# Patient Record
Sex: Male | Born: 1980 | Race: White | Hispanic: No | Marital: Married | Smoking: Former smoker
Health system: Southern US, Community
[De-identification: ages and names within clinical notes are randomized; demographics above are authoritative.]

---

## 2004-11-30 ENCOUNTER — Emergency Department: Payer: Self-pay | Admitting: Emergency Medicine

## 2004-12-05 ENCOUNTER — Ambulatory Visit: Payer: Self-pay

## 2005-05-26 ENCOUNTER — Emergency Department: Payer: Self-pay | Admitting: Emergency Medicine

## 2009-04-22 ENCOUNTER — Emergency Department (HOSPITAL_COMMUNITY): Admission: EM | Admit: 2009-04-22 | Discharge: 2009-04-23 | Payer: Self-pay | Admitting: Emergency Medicine

## 2009-05-13 ENCOUNTER — Encounter: Admission: RE | Admit: 2009-05-13 | Discharge: 2009-05-13 | Payer: Self-pay | Admitting: Family Medicine

## 2009-07-14 ENCOUNTER — Ambulatory Visit (HOSPITAL_COMMUNITY): Admission: RE | Admit: 2009-07-14 | Discharge: 2009-07-14 | Payer: Self-pay | Admitting: Psychiatry

## 2009-07-18 ENCOUNTER — Encounter: Admission: RE | Admit: 2009-07-18 | Discharge: 2009-07-18 | Payer: Self-pay | Admitting: Psychiatry

## 2009-11-01 ENCOUNTER — Ambulatory Visit (HOSPITAL_COMMUNITY): Admission: RE | Admit: 2009-11-01 | Discharge: 2009-11-01 | Payer: Self-pay | Admitting: Cardiology

## 2009-11-10 ENCOUNTER — Ambulatory Visit (HOSPITAL_COMMUNITY): Admission: RE | Admit: 2009-11-10 | Discharge: 2009-11-10 | Payer: Self-pay | Admitting: Cardiology

## 2010-10-15 ENCOUNTER — Encounter: Payer: Self-pay | Admitting: Family Medicine

## 2010-12-11 IMAGING — CT CT HEAD W/O CM
1 series · 16 of 30 positions shown, 20 images · non-contrast
Comparison: None

CLINICAL DATA: Shortness of breath.

CT HEAD WITHOUT CONTRAST
TECHNIQUE: Contiguous axial images were obtained from the base of
the skull through the vertex without contrast.

[Series 2: headseq 4.8 h45s · axial · 0.43mm/px · z∈[-184,-32]mm · 16 of 36 slices shown, 20 images]
[im 2/36  brain]
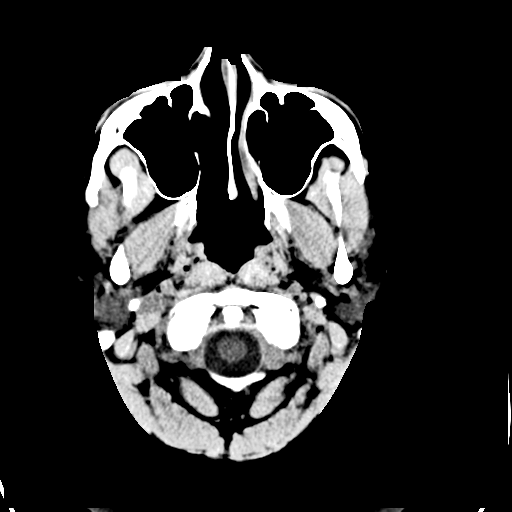
[im 2/36  bone]
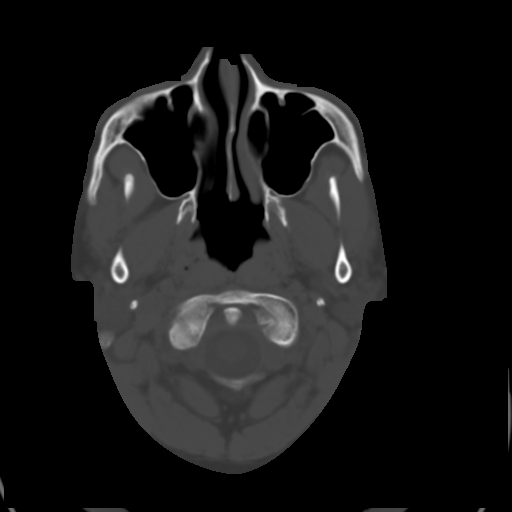
[im 4/36  brain]
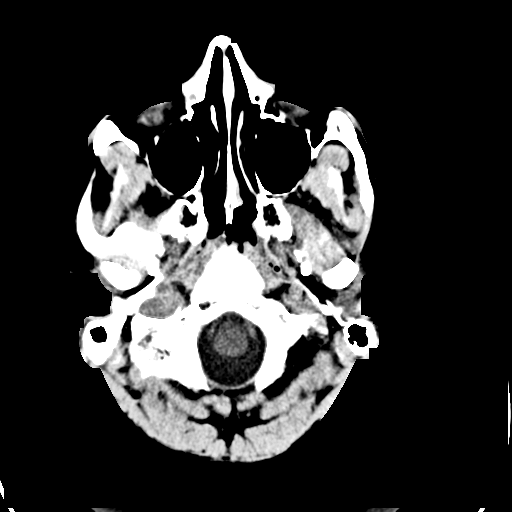
[im 7/36  brain]
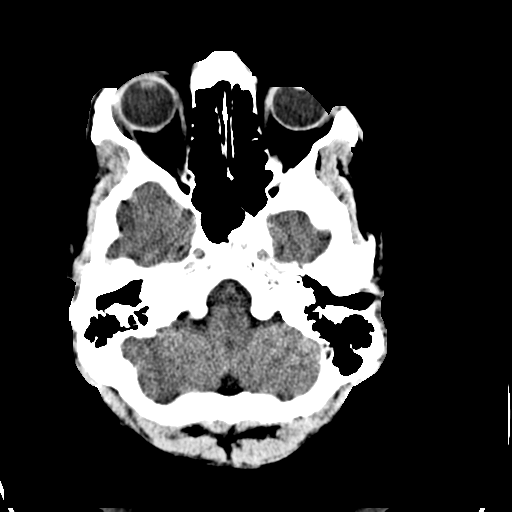
[im 9/36  brain]
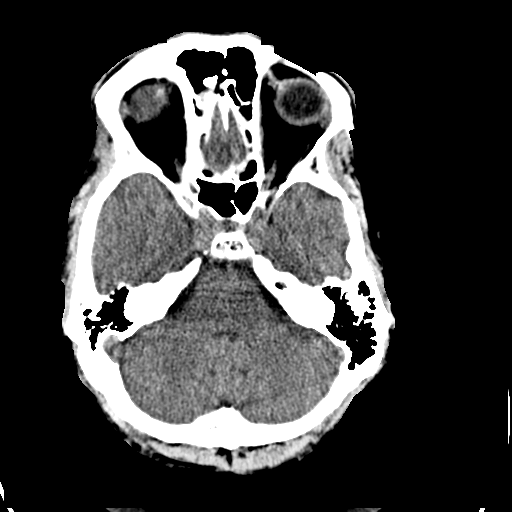
[im 10/36  brain]
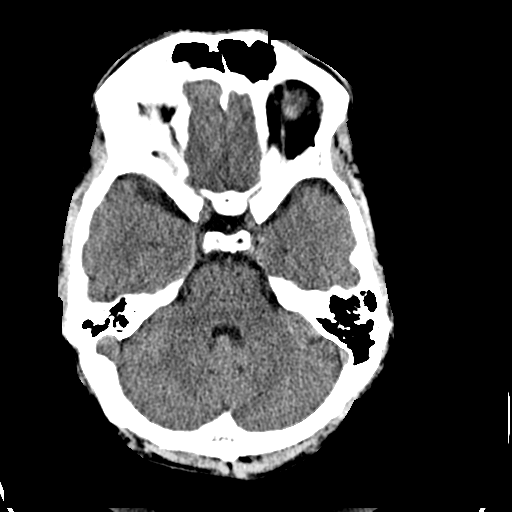
[im 10/36  bone]
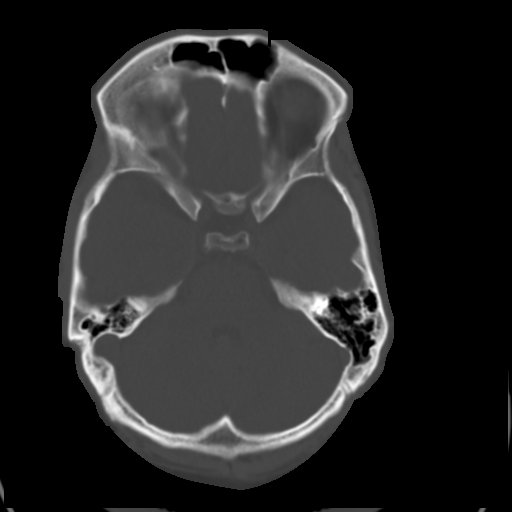
[im 13/36  brain]
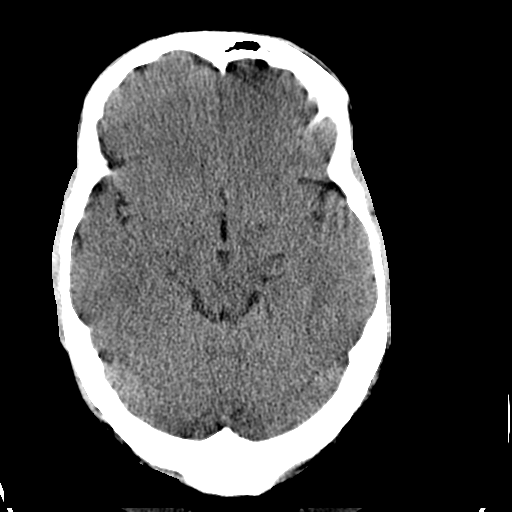
[im 15/36  brain]
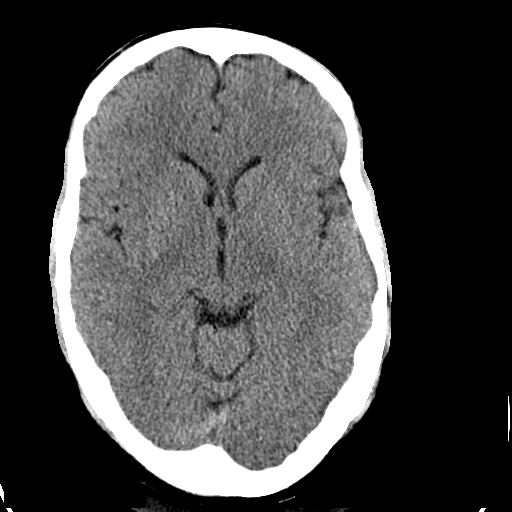
[im 17/36  brain]
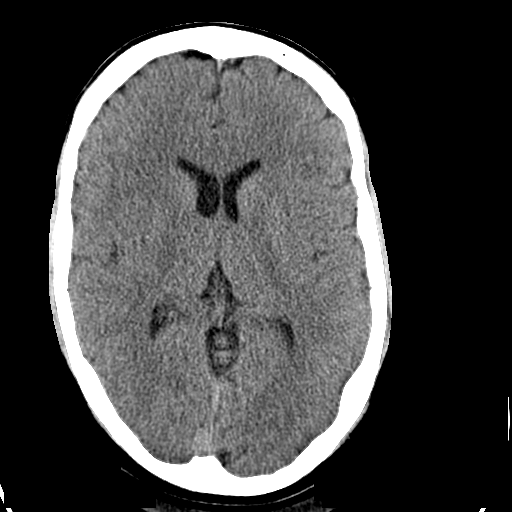
[im 19/36  brain]
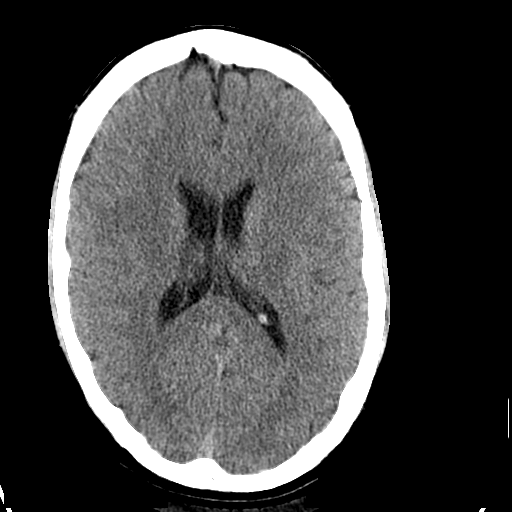
[im 19/36  bone]
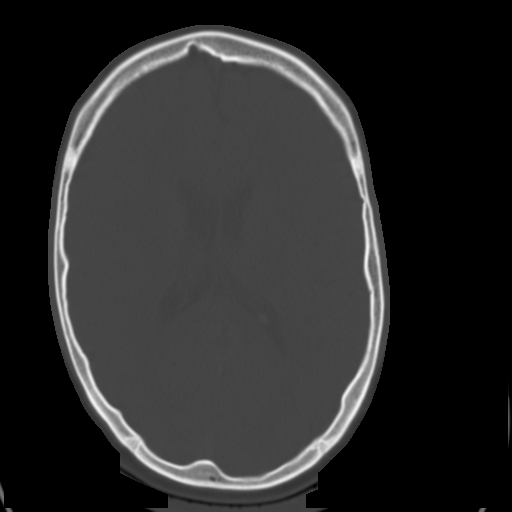
[im 21/36  brain]
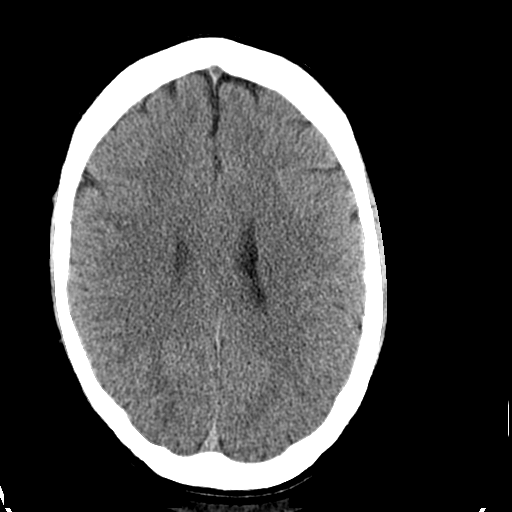
[im 23/36  brain]
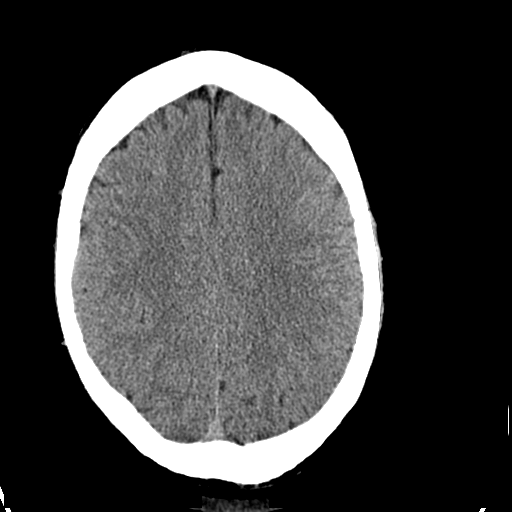
[im 26/36  brain]
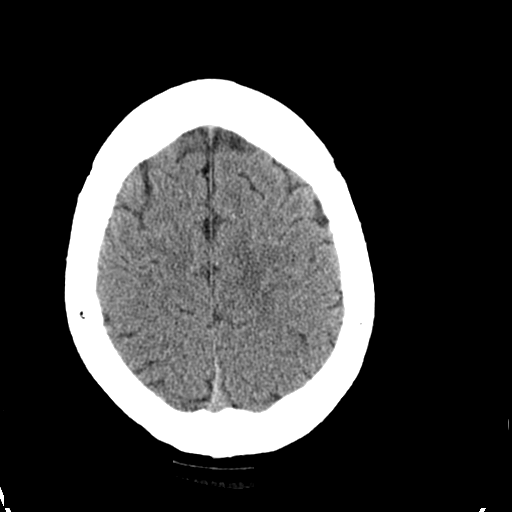
[im 27/36  brain]
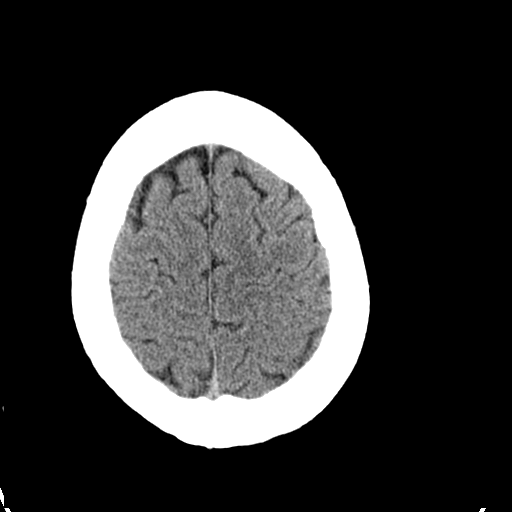
[im 27/36  bone]
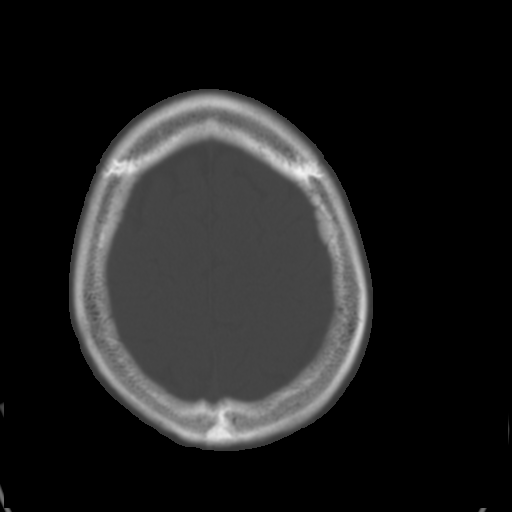
[im 29/36  brain]
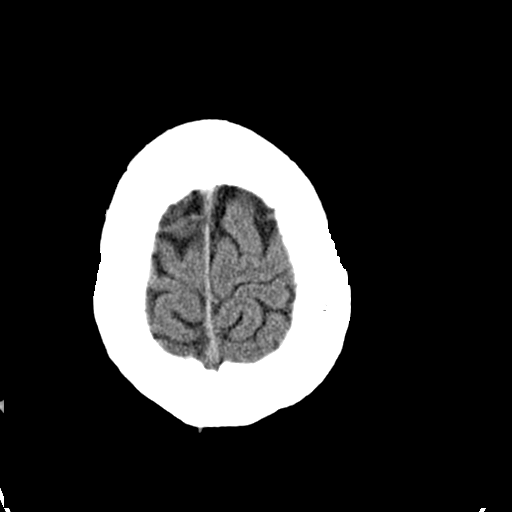
[im 32/36  brain]
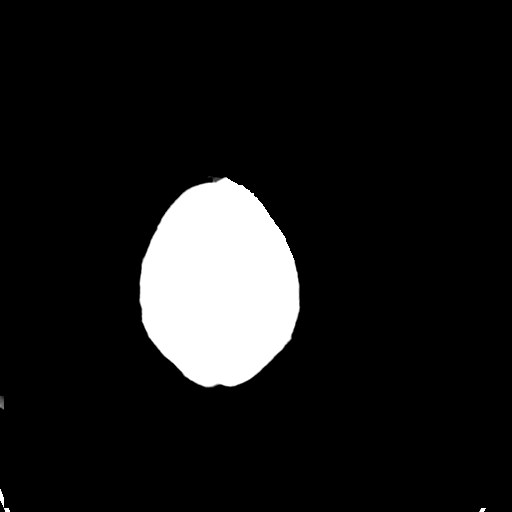
[im 34/36  brain]
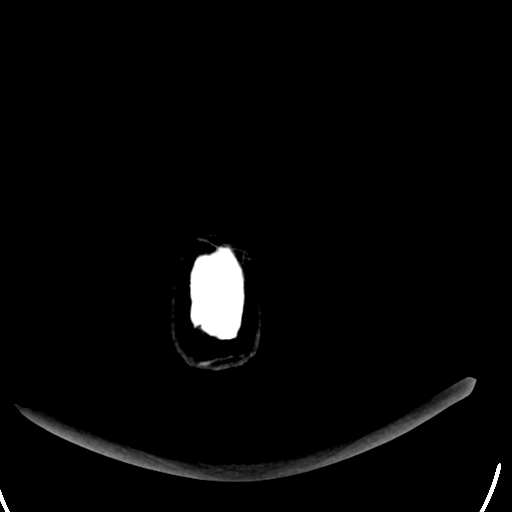

[16 of 30 positions shown; findings below may reference images not displayed]

FINDINGS: No acute intracranial abnormality.  Specifically, no
hemorrhage, hydrocephalus, mass lesion, acute infarction, or
significant intracranial injury.  No acute calvarial abnormality.

Visualized paranasal sinuses and mastoids clear.  Orbital soft
tissues unremarkable.
IMPRESSION: No acute intracranial abnormality.

## 2010-12-11 IMAGING — CR DG CHEST 2V
2 series · 2 of 2 positions shown · non-contrast
Comparison: None.

CLINICAL DATA: Shortness of breath.  Left arm pain.

CHEST - 2 VIEW

[w chest pa]
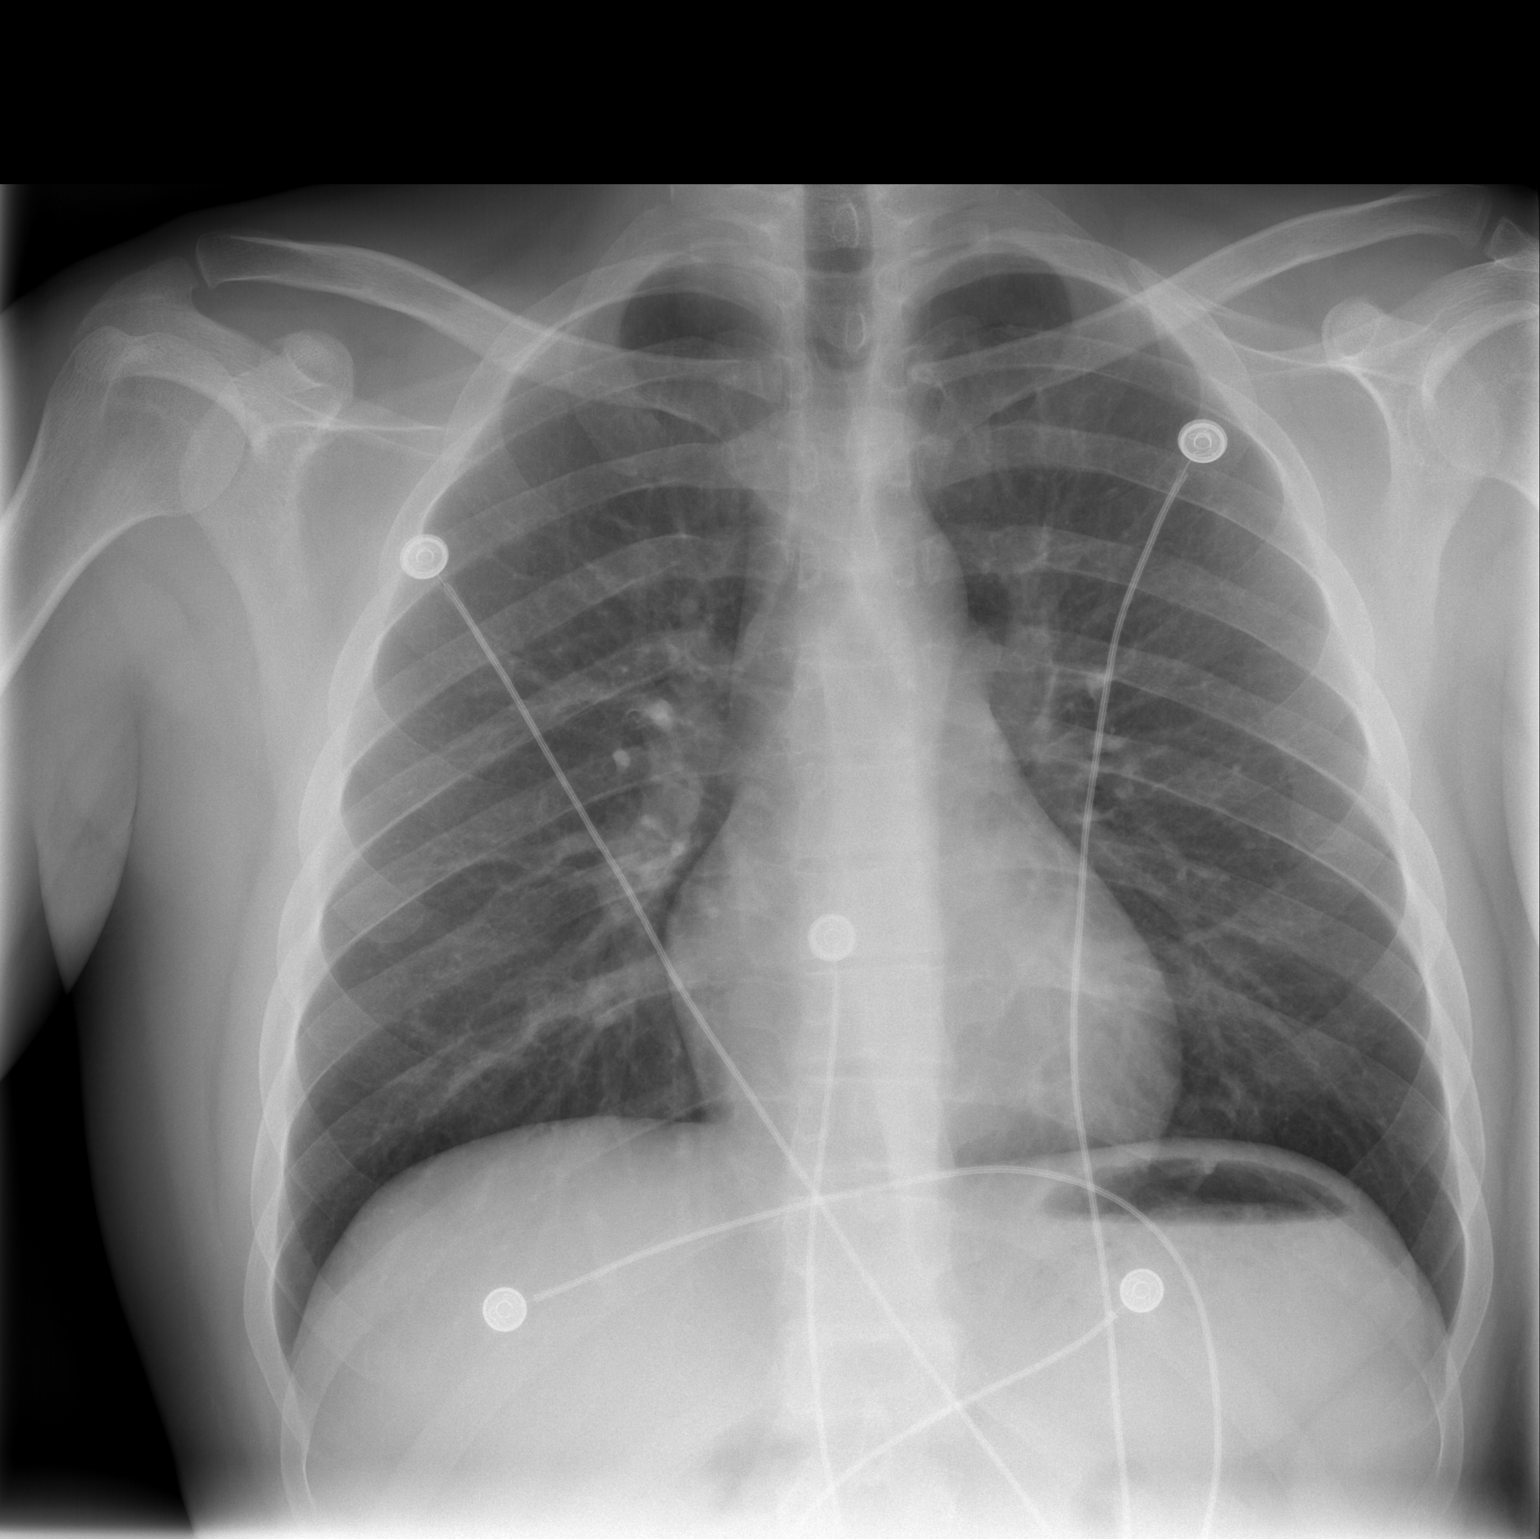

[w chest lat]
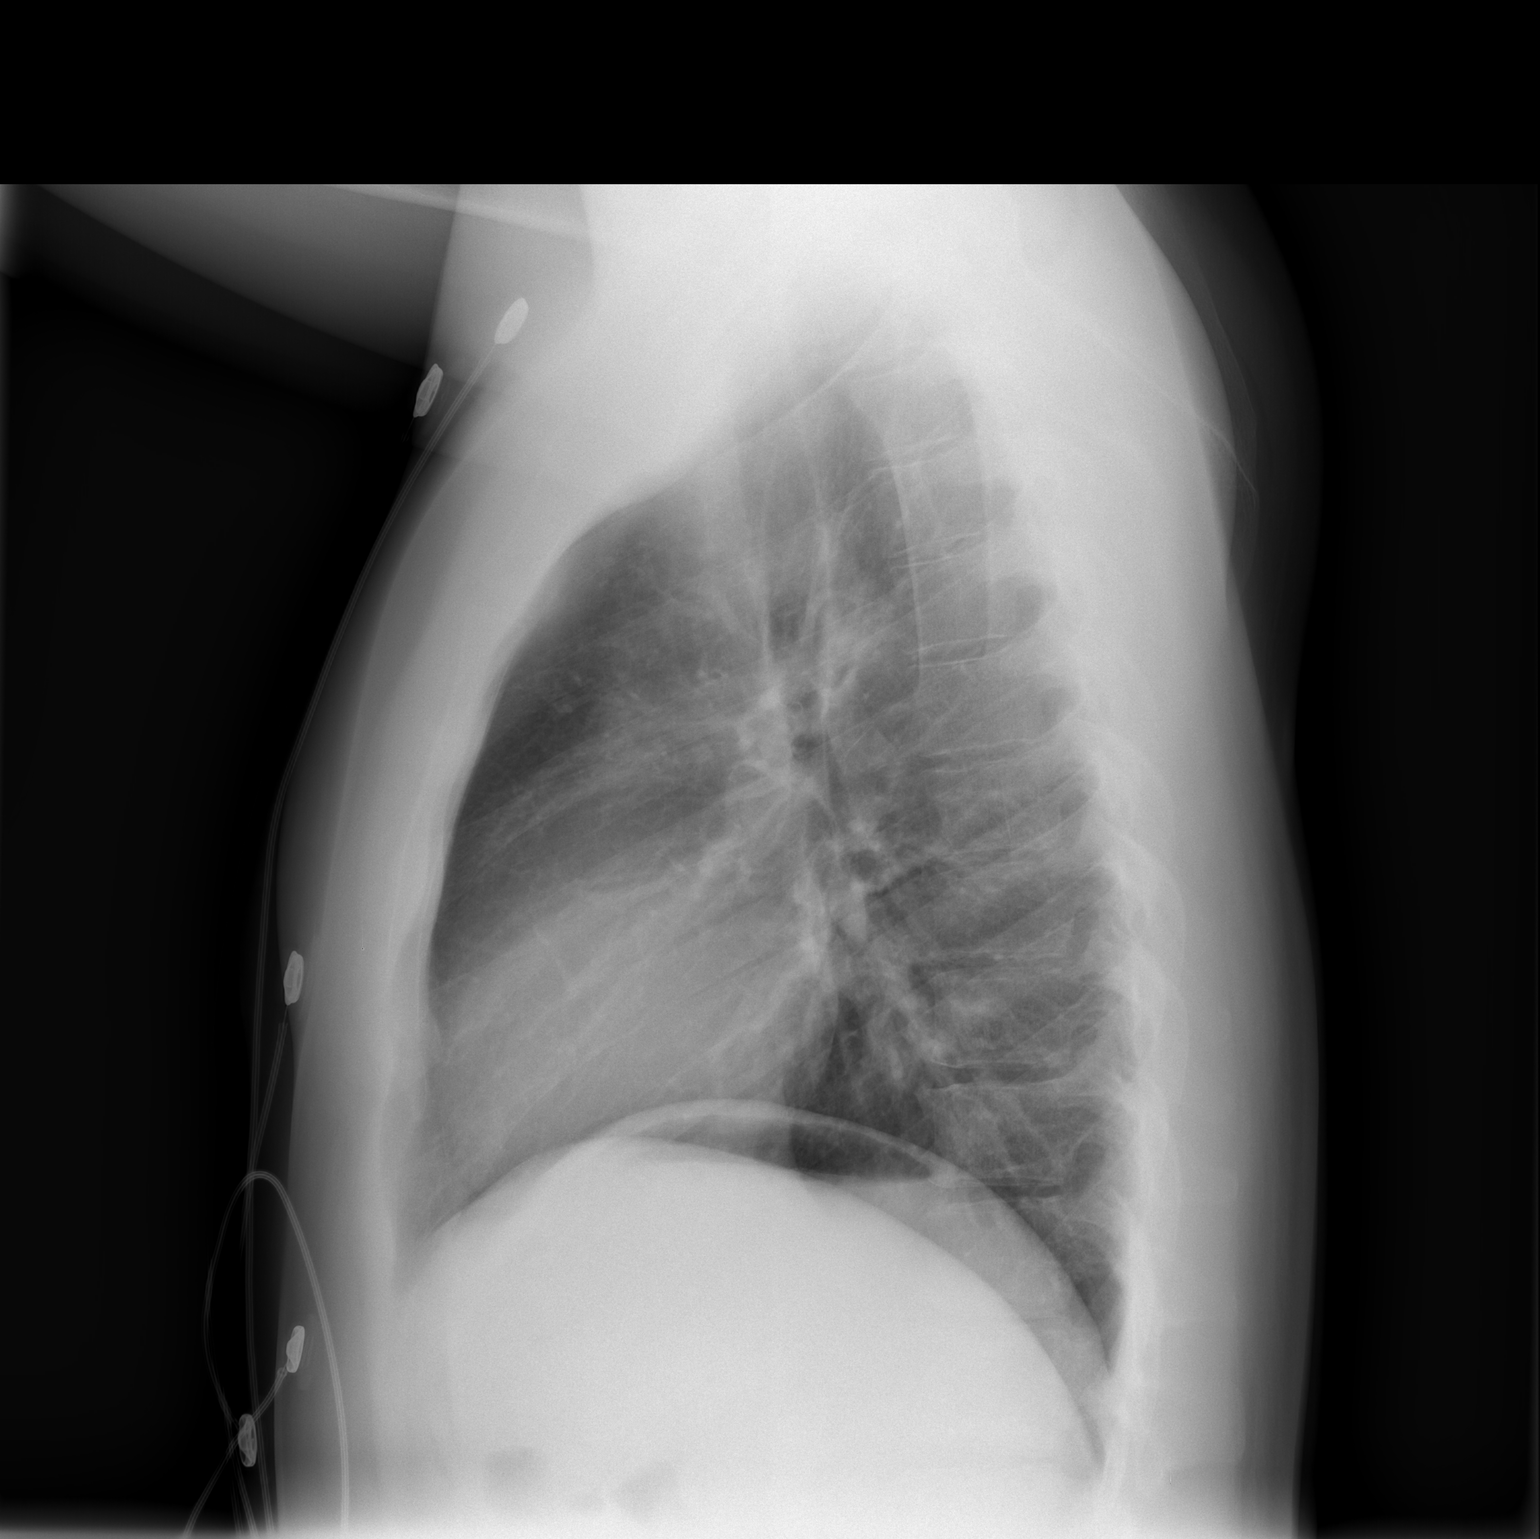

[2 of 2 positions shown; findings below may reference images not displayed]

FINDINGS: Normal sized heart.  Clear lungs.  Minimal central
peribronchial thickening.  Unremarkable bones.
IMPRESSION: Minimal central bronchitic changes.

## 2010-12-27 LAB — CSF CELL COUNT WITH DIFFERENTIAL
RBC Count, CSF: 1 /mm3 — ABNORMAL HIGH
WBC, CSF: 1 /mm3 (ref 0–5)

## 2010-12-27 LAB — BODY FLUID CULTURE: Culture: NO GROWTH

## 2010-12-30 LAB — COMPREHENSIVE METABOLIC PANEL
ALT: 15 U/L (ref 0–53)
AST: 18 U/L (ref 0–37)
Albumin: 4.2 g/dL (ref 3.5–5.2)
Alkaline Phosphatase: 39 U/L (ref 39–117)
Creatinine, Ser: 0.89 mg/dL (ref 0.4–1.5)
GFR calc Af Amer: >60 mL/min (ref >60)
GFR calc non Af Amer: >60 mL/min (ref >60)
Total Protein: 6.4 g/dL (ref 6.0–8.3)

## 2010-12-30 LAB — DIFFERENTIAL
Basophils Absolute: 0 10*3/uL (ref 0.0–0.1)
Basophils Relative: 0 % (ref 0–1)
Eosinophils Absolute: 0.1 10*3/uL (ref 0.0–0.7)
Eosinophils Relative: 1 % (ref 0–5)
Monocytes Absolute: 0.7 10*3/uL (ref 0.1–1.0)

## 2010-12-30 LAB — CBC
Hemoglobin: 14.4 g/dL (ref 13.0–17.0)
MCV: 91.7 fL (ref 78.0–100.0)
Platelets: 213 10*3/uL (ref 150–400)
RBC: 4.54 MIL/uL (ref 4.22–5.81)
RDW: 12 % (ref 11.5–15.5)

## 2010-12-30 LAB — D-DIMER, QUANTITATIVE: D-Dimer, Quant: <0.22 ug/mL-FEU (ref 0.00–0.48)

## 2014-02-28 ENCOUNTER — Encounter: Payer: Self-pay | Admitting: Cardiology

## 2014-02-28 ENCOUNTER — Ambulatory Visit (INDEPENDENT_AMBULATORY_CARE_PROVIDER_SITE_OTHER): Payer: BC Managed Care – PPO | Admitting: Cardiology

## 2014-02-28 VITALS — BP 112/86 | HR 78 | Ht 73.0 in | Wt 218.3 lb

## 2014-02-28 DIAGNOSIS — R5383 Other fatigue: Secondary | ICD-10-CM

## 2014-02-28 DIAGNOSIS — R5381 Other malaise: Secondary | ICD-10-CM

## 2014-02-28 DIAGNOSIS — R002 Palpitations: Secondary | ICD-10-CM

## 2014-02-28 LAB — TSH: TSH: 1.066 u[IU]/mL (ref 0.350–4.500)

## 2014-02-28 NOTE — Patient Instructions (Signed)
Your physician recommends that you schedule a follow-up appointment As Needed if palpitation continue please call.  Your physician recommends that you return for lab work TSH

## 2014-02-28 NOTE — Progress Notes (Signed)
Patient ID: Troy Everett, male   DOB: Jul 20, 1981, 33 y.o.   MRN: [id number]    02/28/2014 Troy Everett   1981/03/30  [id number]  Primary Physicia Troy Alken, MD Primary Cardiologist: Dr Troy Everett  HPI:  The patient is a 33 year old male who presents to clinic with a complaint of intermittent palpitations for last 4 weeks. He has been seen in the past by Dr. Dossie Arbour and Dr. Sheliah Mends for similar complaints. He subsequently underwent a variety of test, including a treadmill stress test, an echocardiogram, and a CT angiogram including coronary imaging, all of which were normal. There was no evidence of any structural abnormalities. He was reevaluated 2 years later by Dr. Royann Everett in May 2013. His chief complaint was palpitations. Dr Troy Everett recommended a 24 hour Holter monitor. There were no remarkable findings. Dr. Royann Everett recommended that he increase his exercise in an attempt to try to lose the weight he again and also suggested curtailing his alcohol intake.  Troy Everett presents back to clinic today stating that he was doing fairly and had no further recurrence of palpitations, up until about 4 weeks ago. For the last month he has had intermittent palpitations which he describes as frequent but isolated premature beats. He denies any other associated symptoms. He denies chest pain, shortness of breath, dizziness, lightheadedness, syncope/near-syncope. No cold or heat intolerances. No excessive weight gain or weight loss. No diarrhea or constipation. He admits that he has been under increased stress over the last several weeks. He has 2 small children at home, ages 1 and 36 and he and his wife are in the process of adoption. He also just started a new job and his new work hours have caused disruption in his sleep pattern. These new lifestyle changes have also caused increased anxiety. He denies tobacco use. He drinks alcohol but denies daily intake, stating that he only  drinks socially, but nothing in excess. He does note daily caffeine intake and drinks on average 2 cups of coffee per day. He does not exercise regularly.    No current outpatient prescriptions on file.   No current facility-administered medications for this visit.    No Known Allergies  History   Social History  . Marital Status: Married    Spouse Name: N/A    Number of Children: N/A  . Years of Education: N/A   Occupational History  . Not on file.   Social History Main Topics  . Smoking status: Former Games developer  . Smokeless tobacco: Never Used  . Alcohol Use: Yes     Comment: Socially  . Drug Use: Not on file  . Sexual Activity: Not on file   Other Topics Concern  . Not on file   Social History Narrative  . No narrative on file     Review of Systems: General: negative for chills, fever, night sweats or weight changes.  Cardiovascular: negative for chest pain, dyspnea on exertion, edema, orthopnea, palpitations, paroxysmal nocturnal dyspnea or shortness of breath Dermatological: negative for rash Respiratory: negative for cough or wheezing Urologic: negative for hematuria Abdominal: negative for nausea, vomiting, diarrhea, bright red blood per rectum, melena, or hematemesis Neurologic: negative for visual changes, syncope, or dizziness All other systems reviewed and are otherwise negative except as noted above.    Blood pressure 112/86, pulse 78, height 6\' 1"  (1.854 m), weight 218 lb 4.8 oz (99.02 kg).  General appearance: alert, cooperative and no distress Neck: no carotid bruit and no JVD Lungs:  clear to auscultation bilaterally Heart: regular rate and rhythm, S1, S2 normal, no murmur, click, rub or gallop Extremities: extremities normal, atraumatic, no cyanosis or edema Pulses: 2+ and symmetric no LEE Skin: Skin color, texture, turgor normal. No rashes or lesions Neurologic: Grossly normal  EKG: Normal sinus rhythm. Heart rate 78 beats per  minute.  ASSESSMENT AND PLAN:   1. Palpitations: Patient notes symptoms for the past 4 weeks which he describes as isolated skipped beats. He denies any other associated symptoms including chest pain, shortness of breath, lightheadedness, dizziness, syncope/near-syncope. He has had extensive workups in the past including a treadmill exercise test, echocardiogram, CT angio and evaluation by Holter monitor, all of which were unremarkable. His EKG today demonstrates normal sinus rhythm with a ventricular rate of 78 beats per minute and his physical exam is unremarkable.His blood pressure is normal. Today we will check a TSH. He wishes to avoid initiation of medications if possible. We have discussed lifestyle modifications, including reducing stress levels, decreasing caffeine consumption, exercising for weight loss and improving his sleep habits. After discussion, we have decided to see if he has any improvement in symptoms after making the above changes. If he continues to have symptoms despite these efforts, we will consider re-evaluating him with a two-week heart monitor and if needed, will consider initiating either a low dose calcium channel blocker or a low-dose beta blocker. He has been instructed to seek emergent medical evaluation if he develops any symptoms of prolonged palpitations, accompanied by chest pain, dyspnea, syncope/near-syncope. He verbalized understanding. He is to followup if he continues to have symptoms despite making lifestyle changes, as we discussed. If he has no other issues, he can followup as needed.  PLAN:  We'll check a TSH today. He has agreed to make the lifestyle changes outlined above. Followup in 2-3 weeks if symptoms worsen or fail to improve, otherwise followup as needed.  Troy Trefry SimmonsPA-C 02/28/2014 9:25 AM

## 2014-03-04 ENCOUNTER — Encounter (HOSPITAL_COMMUNITY): Payer: Self-pay | Admitting: Emergency Medicine

## 2014-03-04 ENCOUNTER — Emergency Department (INDEPENDENT_AMBULATORY_CARE_PROVIDER_SITE_OTHER)
Admission: EM | Admit: 2014-03-04 | Discharge: 2014-03-04 | Disposition: A | Discharge status: Home / Self Care | Payer: BC Managed Care – PPO | Source: Home / Self Care | Attending: Emergency Medicine | Admitting: Emergency Medicine

## 2014-03-04 DIAGNOSIS — M79603 Pain in arm, unspecified: Secondary | ICD-10-CM

## 2014-03-04 DIAGNOSIS — M79609 Pain in unspecified limb: Secondary | ICD-10-CM

## 2014-03-04 NOTE — ED Notes (Signed)
Right arm with bruise at ac at needle stick site-reports he had blood drawn Monday 6/8.  Patient has noted soreness from bruise to mid way of forearm.  Patient concerned and wants to make sure it is ok,.  Radial pulse 2 plus and able to move fingers

## 2014-03-04 NOTE — ED Provider Notes (Signed)
Medical screening examination/treatment/procedure(s) were performed by non-physician practitioner and as supervising physician I was immediately available for consultation/collaboration.  Leslee Homeavid Merrit Friesen, M.D.  Reuben Likesavid C Kealohilani Maiorino, MD 03/04/14 2147

## 2014-03-04 NOTE — ED Provider Notes (Signed)
CSN: [id number]     Arrival date & time 03/04/14  1131 History   First MD Initiated Contact with Patient 03/04/14 1305     Chief Complaint  Patient presents with  . Arm Pain   (Consider location/radiation/quality/duration/timing/severity/associated sxs/prior Treatment) HPI Comments: 33 year old male presents complaining of right arm pain. Her blood drawn on Monday, 4 days ago. Since then he has had some pain in his arm. He reports swelling in his arm and some bruising. He also reports that he went to ask the phlebotomist about this who drew his blood, and when they were pushing on his arm he nearly fainted. He describes the pain as mild to moderate. No numbness in the hand. No swelling distal to this. No history of DVT or PE. No systemic symptoms.  Patient is a 33 y.o. male presenting with arm pain.  Arm Pain    History reviewed. No pertinent past medical history. History reviewed. No pertinent past surgical history. No family history on file. History  Substance Use Topics  . Smoking status: Former Games developermoker  . Smokeless tobacco: Never Used  . Alcohol Use: Yes     Comment: Socially    Review of Systems  Musculoskeletal:       Arm pain  Skin: Positive for color change (small area of bruising).  All other systems reviewed and are negative.   Allergies  Review of patient's allergies indicates no known allergies.  Home Medications   Prior to Admission medications   Not on File   BP 122/82  Pulse 68  Temp(Src) 98.1 F (36.7 C) (Oral)  Resp 16  SpO2 98% Physical Exam  Nursing note and vitals reviewed. Constitutional: He is oriented to person, place, and time. He appears well-developed and well-nourished. No distress.  HENT:  Head: Normocephalic.  Cardiovascular:  Pulses:      Radial pulses are 2+ on the right side, and 2+ on the left side.  Pulmonary/Chest: Effort normal. No respiratory distress.  Musculoskeletal:  At the right a.c., there is a very small 1 cm diameter  bruise. There is no swelling around this. Apart from the bruise, the arm is completely normal  Neurological: He is alert and oriented to person, place, and time. He has normal strength. No sensory deficit. Coordination normal.  Skin: Skin is warm and dry. No rash noted. He is not diaphoretic.  Psychiatric: He has a normal mood and affect. Judgment normal.    ED Course  Procedures (including critical care time) Labs Review Labs Reviewed - No data to display  Imaging Review No results found.   MDM   1. Arm pain    Possibly very mild phlebitis versus very small hematoma. Ice, elevate, ibuprofen when necessary, followup as needed    Graylon GoodZachary H Janely Gullickson, PA-C 03/04/14 1357

## 2016-02-21 ENCOUNTER — Emergency Department (HOSPITAL_BASED_OUTPATIENT_CLINIC_OR_DEPARTMENT_OTHER)
Admission: EM | Admit: 2016-02-21 | Discharge: 2016-02-21 | Disposition: A | Discharge status: Home / Self Care | Payer: BLUE CROSS/BLUE SHIELD | Attending: Emergency Medicine | Admitting: Emergency Medicine

## 2016-02-21 ENCOUNTER — Encounter (HOSPITAL_BASED_OUTPATIENT_CLINIC_OR_DEPARTMENT_OTHER): Payer: Self-pay | Admitting: *Deleted

## 2016-02-21 DIAGNOSIS — Y92002 Bathroom of unspecified non-institutional (private) residence single-family (private) house as the place of occurrence of the external cause: Secondary | ICD-10-CM | POA: Insufficient documentation

## 2016-02-21 DIAGNOSIS — R110 Nausea: Secondary | ICD-10-CM | POA: Diagnosis not present

## 2016-02-21 DIAGNOSIS — S8012XA Contusion of left lower leg, initial encounter: Secondary | ICD-10-CM | POA: Insufficient documentation

## 2016-02-21 DIAGNOSIS — S060X0A Concussion without loss of consciousness, initial encounter: Secondary | ICD-10-CM

## 2016-02-21 DIAGNOSIS — Y999 Unspecified external cause status: Secondary | ICD-10-CM | POA: Diagnosis not present

## 2016-02-21 DIAGNOSIS — Z87891 Personal history of nicotine dependence: Secondary | ICD-10-CM | POA: Diagnosis not present

## 2016-02-21 DIAGNOSIS — W182XXA Fall in (into) shower or empty bathtub, initial encounter: Secondary | ICD-10-CM | POA: Insufficient documentation

## 2016-02-21 DIAGNOSIS — Y939 Activity, unspecified: Secondary | ICD-10-CM | POA: Diagnosis not present

## 2016-02-21 DIAGNOSIS — S0990XA Unspecified injury of head, initial encounter: Secondary | ICD-10-CM | POA: Diagnosis present

## 2016-02-21 NOTE — ED Provider Notes (Signed)
CSN: [id number]     Arrival date & time 02/21/16  1710 History   By signing my name below, I, Tanda RockersMargaux Venter, attest that this documentation has been prepared under the direction and in the presence of Cathren LaineKevin Delmo Matty, MD. Electronically Signed: Tanda RockersMargaux Venter, ED Scribe. 02/21/2016. 6:37 PM.    Chief Complaint  Patient presents with  . Head Injury    The history is provided by the patient. No language interpreter was used.    HPI Comments: Troy Everett is a 35 y.o. male who presents to the Emergency Department s/p fall that occurred 3 days ago. He reports that he was standing on the vanity of the bathroom, where he missed his foot placement. He fell into the bathtub, hit his left leg on the tub and the front of his head on the tub. No LOC. Pt has an associated constant, dull, mild headache and lower left leg pain for the past 3 days. He also states he feels nauseous and dizzy. Pt was seen at Urgent Care where they noted a large hematoma on pt's left lower leg. UC did not seem concerned about patient's head. Pt has been able to ambulate since the incident. He notes that he has taken Ibuprofen for pain with mild relief. He notes he has not passed out since the incident. He is not on any anti-coagulants. Pt denies any blurry vision, slurred speech, vomiting, diarrhea, chest pain, back pain, neck pain, abdominal pain or SOB.   History reviewed. No pertinent past medical history. History reviewed. No pertinent past surgical history. History reviewed. No pertinent family history. Social History  Substance Use Topics  . Smoking status: Former Games developermoker  . Smokeless tobacco: Never Used  . Alcohol Use: Yes     Comment: Socially    Review of Systems  Constitutional: Negative for fever.  Eyes: Negative for pain and visual disturbance.  Respiratory: Negative for shortness of breath.   Cardiovascular: Negative for chest pain.  Gastrointestinal: Positive for nausea. Negative for vomiting, abdominal  pain and diarrhea.  Musculoskeletal: Positive for myalgias and arthralgias. Negative for back pain and neck pain.  Neurological: Positive for dizziness and headaches. Negative for syncope, speech difficulty, weakness and numbness.  All other systems reviewed and are negative.  Allergies  Review of patient's allergies indicates no known allergies.  Home Medications   Prior to Admission medications   Not on File   BP 127/90 mmHg  Pulse 81  Temp(Src) 98.2 F (36.8 C) (Oral)  Resp 16  Ht 6\' 1"  (1.854 m)  Wt 235 lb (106.595 kg)  BMI 31.01 kg/m2  SpO2 100%   Physical Exam  Constitutional: He is oriented to person, place, and time. He appears well-developed and well-nourished. No distress.  HENT:  Head: Normocephalic and atraumatic.  Eyes: Conjunctivae and EOM are normal. Pupils are equal, round, and reactive to light. No scleral icterus.  Neck: Normal range of motion. Neck supple.  Cardiovascular: Normal rate, regular rhythm, normal heart sounds and intact distal pulses.  Exam reveals no gallop and no friction rub.   No murmur heard. Pulmonary/Chest: Effort normal and breath sounds normal. No respiratory distress. He has no wheezes. He has no rales.  Abdominal: Soft. Bowel sounds are normal. He exhibits no distension. There is no tenderness. There is no rebound and no guarding.  Musculoskeletal: Normal range of motion. He exhibits no edema or tenderness.  CTLS spine, non tender, aligned, no step off.   Neurological: He is alert and oriented to  person, place, and time.  Motor intact bil, stre 5/5. sens grossly intact. Steady gait.   Skin: Skin is warm and dry. No rash noted. He is not diaphoretic.  Left lower leg contusion with mild ecchymosis.  Psychiatric: He has a normal mood and affect. Judgment normal.  Nursing note and vitals reviewed.   ED Course  Procedures (including critical care time) DIAGNOSTIC STUDIES: Oxygen Saturation is 100% on RA, normal by my interpretation.     COORDINATION OF CARE: 6:37 PM-Discussed treatment plan with pt at bedside and pt agreed to plan.      MDM   I personally performed the services described in this documentation, which was scribed in my presence. The recorded information has been reviewed and considered.  Patient s/p mechanical fall in bathroom. No loc. Is awake and alert. Has been ambulatory with normal function in the past few days since fall.  Symptoms felt c/w mild concussion.  Spine nt.   Pt currently appears stable for d/c.     Cathren Laine, MD 02/24/16 0004

## 2016-02-21 NOTE — Discharge Instructions (Signed)
It was our pleasure to provide your ER care today - we hope that you feel better.  Rest. Drink adequate fluids.  Take tylenol/advil as need for symptom relief.  No contact sports until symptoms have completely resolved.   For leg contusion, elevate leg, coldpack to sore area, tylenol/advil as need.  Follow up with primary care doctor in 1 week if symptoms fail to improve/resolve.  Return to ER if worse, new symptoms, severe pain, persistent vomiting, other concern.      Concussion, Adult A concussion, or closed-head injury, is a brain injury caused by a direct blow to the head or by a quick and sudden movement (jolt) of the head or neck. Concussions are usually not life-threatening. Even so, the effects of a concussion can be serious. If you have had a concussion before, you are more likely to experience concussion-like symptoms after a direct blow to the head.  CAUSES  Direct blow to the head, such as from running into another player during a soccer game, being hit in a fight, or hitting your head on a hard surface.  A jolt of the head or neck that causes the brain to move back and forth inside the skull, such as in a car crash. SIGNS AND SYMPTOMS The signs of a concussion can be hard to notice. Early on, they may be missed by you, family members, and health care providers. You may look fine but act or feel differently. Symptoms are usually temporary, but they may last for days, weeks, or even longer. Some symptoms may appear right away while others may not show up for hours or days. Every head injury is different. Symptoms include:  Mild to moderate headaches that will not go away.  A feeling of pressure inside your head.  Having more trouble than usual:  Learning or remembering things you have heard.  Answering questions.  Paying attention or concentrating.  Organizing daily tasks.  Making decisions and solving problems.  Slowness in thinking, acting or reacting,  speaking, or reading.  Getting lost or being easily confused.  Feeling tired all the time or lacking energy (fatigued).  Feeling drowsy.  Sleep disturbances.  Sleeping more than usual.  Sleeping less than usual.  Trouble falling asleep.  Trouble sleeping (insomnia).  Loss of balance or feeling lightheaded or dizzy.  Nausea or vomiting.  Numbness or tingling.  Increased sensitivity to:  Sounds.  Lights.  Distractions.  Vision problems or eyes that tire easily.  Diminished sense of taste or smell.  Ringing in the ears.  Mood changes such as feeling sad or anxious.  Becoming easily irritated or angry for little or no reason.  Lack of motivation.  Seeing or hearing things other people do not see or hear (hallucinations). DIAGNOSIS Your health care provider can usually diagnose a concussion based on a description of your injury and symptoms. He or she will ask whether you passed out (lost consciousness) and whether you are having trouble remembering events that happened right before and during your injury. Your evaluation might include:  A brain scan to look for signs of injury to the brain. Even if the test shows no injury, you may still have a concussion.  Blood tests to be sure other problems are not present. TREATMENT  Concussions are usually treated in an emergency department, in urgent care, or at a clinic. You may need to stay in the hospital overnight for further treatment.  Tell your health care provider if you are taking any  medicines, including prescription medicines, over-the-counter medicines, and natural remedies. Some medicines, such as blood thinners (anticoagulants) and aspirin, may increase the chance of complications. Also tell your health care provider whether you have had alcohol or are taking illegal drugs. This information may affect treatment.  Your health care provider will send you home with important instructions to follow.  How fast  you will recover from a concussion depends on many factors. These factors include how severe your concussion is, what part of your brain was injured, your age, and how healthy you were before the concussion.  Most people with mild injuries recover fully. Recovery can take time. In general, recovery is slower in older persons. Also, persons who have had a concussion in the past or have other medical problems may find that it takes longer to recover from their current injury. HOME CARE INSTRUCTIONS General Instructions  Carefully follow the directions your health care provider gave you.  Only take over-the-counter or prescription medicines for pain, discomfort, or fever as directed by your health care provider.  Take only those medicines that your health care provider has approved.  Do not drink alcohol until your health care provider says you are well enough to do so. Alcohol and certain other drugs may slow your recovery and can put you at risk of further injury.  If it is harder than usual to remember things, write them down.  If you are easily distracted, try to do one thing at a time. For example, do not try to watch TV while fixing dinner.  Talk with family members or close friends when making important decisions.  Keep all follow-up appointments. Repeated evaluation of your symptoms is recommended for your recovery.  Watch your symptoms and tell others to do the same. Complications sometimes occur after a concussion. Older adults with a brain injury may have a higher risk of serious complications, such as a blood clot on the brain.  Tell your teachers, school nurse, school counselor, coach, athletic trainer, or work Production designer, theatre/television/film about your injury, symptoms, and restrictions. Tell them about what you can or cannot do. They should watch for:  Increased problems with attention or concentration.  Increased difficulty remembering or learning new information.  Increased time needed to  complete tasks or assignments.  Increased irritability or decreased ability to cope with stress.  Increased symptoms.  Rest. Rest helps the brain to heal. Make sure you:  Get plenty of sleep at night. Avoid staying up late at night.  Keep the same bedtime hours on weekends and weekdays.  Rest during the day. Take daytime naps or rest breaks when you feel tired.  Limit activities that require a lot of thought or concentration. These include:  Doing homework or job-related work.  Watching TV.  Working on the computer.  Avoid any situation where there is potential for another head injury (football, hockey, soccer, basketball, martial arts, downhill snow sports and horseback riding). Your condition will get worse every time you experience a concussion. You should avoid these activities until you are evaluated by the appropriate follow-up health care providers. Returning To Your Regular Activities You will need to return to your normal activities slowly, not all at once. You must give your body and brain enough time for recovery.  Do not return to sports or other athletic activities until your health care provider tells you it is safe to do so.  Ask your health care provider when you can drive, ride a bicycle, or operate heavy machinery.  Your ability to react may be slower after a brain injury. Never do these activities if you are dizzy.  Ask your health care provider about when you can return to work or school. Preventing Another Concussion It is very important to avoid another brain injury, especially before you have recovered. In rare cases, another injury can lead to permanent brain damage, brain swelling, or death. The risk of this is greatest during the first 7-10 days after a head injury. Avoid injuries by:  Wearing a seat belt when riding in a car.  Drinking alcohol only in moderation.  Wearing a helmet when biking, skiing, skateboarding, skating, or doing similar  activities.  Avoiding activities that could lead to a second concussion, such as contact or recreational sports, until your health care provider says it is okay.  Taking safety measures in your home.  Remove clutter and tripping hazards from floors and stairways.  Use grab bars in bathrooms and handrails by stairs.  Place non-slip mats on floors and in bathtubs.  Improve lighting in dim areas. SEEK MEDICAL CARE IF:  You have increased problems paying attention or concentrating.  You have increased difficulty remembering or learning new information.  You need more time to complete tasks or assignments than before.  You have increased irritability or decreased ability to cope with stress.  You have more symptoms than before. Seek medical care if you have any of the following symptoms for more than 2 weeks after your injury:  Lasting (chronic) headaches.  Dizziness or balance problems.  Nausea.  Vision problems.  Increased sensitivity to noise or light.  Depression or mood swings.  Anxiety or irritability.  Memory problems.  Difficulty concentrating or paying attention.  Sleep problems.  Feeling tired all the time. SEEK IMMEDIATE MEDICAL CARE IF:  You have severe or worsening headaches. These may be a sign of a blood clot in the brain.  You have weakness (even if only in one hand, leg, or part of the face).  You have numbness.  You have decreased coordination.  You vomit repeatedly.  You have increased sleepiness.  One pupil is larger than the other.  You have convulsions.  You have slurred speech.  You have increased confusion. This may be a sign of a blood clot in the brain.  You have increased restlessness, agitation, or irritability.  You are unable to recognize people or places.  You have neck pain.  It is difficult to wake you up.  You have unusual behavior changes.  You lose consciousness. MAKE SURE YOU:  Understand these  instructions.  Will watch your condition.  Will get help right away if you are not doing well or get worse.   This information is not intended to replace advice given to you by your health care provider. Make sure you discuss any questions you have with your health care provider.   Document Released: 11/30/2003 Document Revised: 09/30/2014 Document Reviewed: 04/01/2013 Elsevier Interactive Patient Education 2016 [employer]     Head Injury, Adult You have received a head injury. It does not appear serious at this time. Headaches and vomiting are common following head injury. It should be easy to awaken from sleeping. Sometimes it is necessary for you to stay in the emergency department for a while for observation. Sometimes admission to the hospital may be needed. After injuries such as yours, most problems occur within the first 24 hours, but side effects may occur up to 7-10 days after the injury. It is  important for you to carefully monitor your condition and contact your health care provider or seek immediate medical care if there is a change in your condition. WHAT ARE THE TYPES OF HEAD INJURIES? Head injuries can be as minor as a bump. Some head injuries can be more severe. More severe head injuries include:  A jarring injury to the brain (concussion).  A bruise of the brain (contusion). This mean there is bleeding in the brain that can cause swelling.  A cracked skull (skull fracture).  Bleeding in the brain that collects, clots, and forms a bump (hematoma). WHAT CAUSES A HEAD INJURY? A serious head injury is most likely to happen to someone who is in a car wreck and is not wearing a seat belt. Other causes of major head injuries include bicycle or motorcycle accidents, sports injuries, and falls. HOW ARE HEAD INJURIES DIAGNOSED? A complete history of the event leading to the injury and your current symptoms will be helpful in diagnosing head injuries. Many times, pictures of  the brain, such as CT or MRI are needed to see the extent of the injury. Often, an overnight hospital stay is necessary for observation.  WHEN SHOULD I SEEK IMMEDIATE MEDICAL CARE?  You should get help right away if:  You have confusion or drowsiness.  You feel sick to your stomach (nauseous) or have continued, forceful vomiting.  You have dizziness or unsteadiness that is getting worse.  You have severe, continued headaches not relieved by medicine. Only take over-the-counter or prescription medicines for pain, fever, or discomfort as directed by your health care provider.  You do not have normal function of the arms or legs or are unable to walk.  You notice changes in the black spots in the center of the colored part of your eye (pupil).  You have a clear or bloody fluid coming from your nose or ears.  You have a loss of vision. During the next 24 hours after the injury, you must stay with someone who can watch you for the warning signs. This person should contact local emergency services (911 in the [place] if you have seizures, you become unconscious, or you are unable to wake up. HOW CAN I PREVENT A HEAD INJURY IN THE FUTURE? The most important factor for preventing major head injuries is avoiding motor vehicle accidents. To minimize the potential for damage to your head, it is crucial to wear seat belts while riding in motor vehicles. Wearing helmets while bike riding and playing collision sports (like football) is also helpful. Also, avoiding dangerous activities around the house will further help reduce your risk of head injury.  WHEN CAN I RETURN TO NORMAL ACTIVITIES AND ATHLETICS? You should be reevaluated by your health care provider before returning to these activities. If you have any of the following symptoms, you should not return to activities or contact sports until 1 week after the symptoms have stopped:  Persistent headache.  Dizziness or vertigo.  Poor attention and  concentration.  Confusion.  Memory problems.  Nausea or vomiting.  Fatigue or tire easily.  Irritability.  Intolerant of bright lights or loud noises.  Anxiety or depression.  Disturbed sleep. MAKE SURE YOU:   Understand these instructions.  Will watch your condition.  Will get help right away if you are not doing well or get worse.   This information is not intended to replace advice given to you by your health care provider. Make sure you discuss any questions you have with  your health care provider.   Document Released: 09/09/2005 Document Revised: 09/30/2014 Document Reviewed: 05/17/2013 Elsevier Interactive Patient Education 2016 [employer]    Contusion A contusion is a deep bruise. Contusions happen when an injury causes bleeding under the skin. Symptoms of bruising include pain, swelling, and discolored skin. The skin may turn blue, purple, or yellow. HOME CARE   Rest the injured area.  If told, put ice on the injured area.  Put ice in a plastic bag.  Place a towel between your skin and the bag.  Leave the ice on for 20 minutes, 2-3 times per day.  If told, put light pressure (compression) on the injured area using an elastic bandage. Make sure the bandage is not too tight. Remove it and put it back on as told by your doctor.  If possible, raise (elevate) the injured area above the level of your heart while you are sitting or lying down.  Take over-the-counter and prescription medicines only as told by your doctor. GET HELP IF:  Your symptoms do not get better after several days of treatment.  Your symptoms get worse.  You have trouble moving the injured area. GET HELP RIGHT AWAY IF:   You have very bad pain.  You have a loss of feeling (numbness) in a hand or foot.  Your hand or foot turns pale or cold.   This information is not intended to replace advice given to you by your health care provider. Make sure you discuss any questions you  have with your health care provider.   Document Released: 02/26/2008 Document Revised: 05/31/2015 Document Reviewed: 01/25/2015 Elsevier Interactive Patient Education 2016 [employer]   Cryotherapy Cryotherapy means treatment with cold. Ice or gel packs can be used to reduce both pain and swelling. Ice is the most helpful within the first 24 to 48 hours after an injury or flare-up from overusing a muscle or joint. Sprains, strains, spasms, burning pain, shooting pain, and aches can all be eased with ice. Ice can also be used when recovering from surgery. Ice is effective, has very few side effects, and is safe for most people to use. PRECAUTIONS  Ice is not a safe treatment option for people with:  Raynaud phenomenon. This is a condition affecting small blood vessels in the extremities. Exposure to cold may cause your problems to return.  Cold hypersensitivity. There are many forms of cold hypersensitivity, including:  Cold urticaria. Red, itchy hives appear on the skin when the tissues begin to warm after being iced.  Cold erythema. This is a red, itchy rash caused by exposure to cold.  Cold hemoglobinuria. Red blood cells break down when the tissues begin to warm after being iced. The hemoglobin that carry oxygen are passed into the urine because they cannot combine with blood proteins fast enough.  Numbness or altered sensitivity in the area being iced. If you have any of the following conditions, do not use ice until you have discussed cryotherapy with your caregiver:  Heart conditions, such as arrhythmia, angina, or chronic heart disease.  High blood pressure.  Healing wounds or open skin in the area being iced.  Current infections.  Rheumatoid arthritis.  Poor circulation.  Diabetes. Ice slows the blood flow in the region it is applied. This is beneficial when trying to stop inflamed tissues from spreading irritating chemicals to surrounding tissues. However, if you  expose your skin to cold temperatures for too long or without the proper protection, you can damage your  skin or nerves. Watch for signs of skin damage due to cold. HOME CARE INSTRUCTIONS Follow these tips to use ice and cold packs safely.  Place a dry or damp towel between the ice and skin. A damp towel will cool the skin more quickly, so you may need to shorten the time that the ice is used.  For a more rapid response, add gentle compression to the ice.  Ice for no more than 10 to 20 minutes at a time. The bonier the area you are icing, the less time it will take to get the benefits of ice.  Check your skin after 5 minutes to make sure there are no signs of a poor response to cold or skin damage.  Rest 20 minutes or more between uses.  Once your skin is numb, you can end your treatment. You can test numbness by very lightly touching your skin. The touch should be so light that you do not see the skin dimple from the pressure of your fingertip. When using ice, most people will feel these normal sensations in this order: cold, burning, aching, and numbness.  Do not use ice on someone who cannot communicate their responses to pain, such as small children or people with dementia. HOW TO MAKE AN ICE PACK Ice packs are the most common way to use ice therapy. Other methods include ice massage, ice baths, and cryosprays. Muscle creams that cause a cold, tingly feeling do not offer the same benefits that ice offers and should not be used as a substitute unless recommended by your caregiver. To make an ice pack, do one of the following:  Place crushed ice or a bag of frozen vegetables in a sealable plastic bag. Squeeze out the excess air. Place this bag inside another plastic bag. Slide the bag into a pillowcase or place a damp towel between your skin and the bag.  Mix 3 parts water with 1 part rubbing alcohol. Freeze the mixture in a sealable plastic bag. When you remove the mixture from the freezer,  it will be slushy. Squeeze out the excess air. Place this bag inside another plastic bag. Slide the bag into a pillowcase or place a damp towel between your skin and the bag. SEEK MEDICAL CARE IF:  You develop white spots on your skin. This may give the skin a blotchy (mottled) appearance.  Your skin turns blue or pale.  Your skin becomes waxy or hard.  Your swelling gets worse. MAKE SURE YOU:   Understand these instructions.  Will watch your condition.  Will get help right away if you are not doing well or get worse.   This information is not intended to replace advice given to you by your health care provider. Make sure you discuss any questions you have with your health care provider.   Document Released: 05/06/2011 Document Revised: 09/30/2014 Document Reviewed: 05/06/2011 Else[employer] Interactive Patient Education [employer]

## 2016-02-21 NOTE — ED Notes (Signed)
Pt c/o head injury x 3 days ago , pt c/o nausea and h/a .

## 2016-02-21 NOTE — ED Notes (Addendum)
Pt states on 5/29 he fell off the bathroom counter into the tub approx 3 feet. Pt states that he has had a persistent HA 3/10, nausea. Denies LOC. Pt is awake, A/Ox4, and is in no acute distress.

## 2019-09-14 DIAGNOSIS — F3341 Major depressive disorder, recurrent, in partial remission: Secondary | ICD-10-CM | POA: Diagnosis not present

## 2019-09-14 DIAGNOSIS — F4010 Social phobia, unspecified: Secondary | ICD-10-CM | POA: Diagnosis not present

## 2019-10-15 DIAGNOSIS — F4010 Social phobia, unspecified: Secondary | ICD-10-CM | POA: Diagnosis not present

## 2019-10-15 DIAGNOSIS — F3341 Major depressive disorder, recurrent, in partial remission: Secondary | ICD-10-CM | POA: Diagnosis not present

## 2020-01-05 DIAGNOSIS — Z0000 Encounter for general adult medical examination without abnormal findings: Secondary | ICD-10-CM | POA: Diagnosis not present

## 2020-01-05 DIAGNOSIS — E559 Vitamin D deficiency, unspecified: Secondary | ICD-10-CM | POA: Diagnosis not present

## 2020-01-05 DIAGNOSIS — Z13220 Encounter for screening for lipoid disorders: Secondary | ICD-10-CM | POA: Diagnosis not present
